# Patient Record
Sex: Female | Born: 1975 | Hispanic: Yes | Marital: Single | Smoking: Never smoker
Health system: Southern US, Community
[De-identification: ages and names within clinical notes are randomized; demographics above are authoritative.]

---

## 2005-07-25 ENCOUNTER — Emergency Department: Payer: Self-pay | Admitting: Internal Medicine

## 2007-03-13 ENCOUNTER — Emergency Department: Payer: Self-pay | Admitting: Emergency Medicine

## 2008-08-12 ENCOUNTER — Ambulatory Visit: Payer: Self-pay | Admitting: Family Medicine

## 2008-10-21 ENCOUNTER — Inpatient Hospital Stay: Payer: Self-pay | Admitting: Specialist

## 2008-11-18 ENCOUNTER — Other Ambulatory Visit: Payer: Self-pay | Admitting: General Surgery

## 2008-11-22 ENCOUNTER — Ambulatory Visit: Payer: Self-pay | Admitting: General Surgery

## 2008-11-28 ENCOUNTER — Ambulatory Visit: Payer: Self-pay | Admitting: General Surgery

## 2010-12-07 ENCOUNTER — Emergency Department: Payer: Self-pay | Admitting: *Deleted

## 2011-05-26 IMAGING — US ABDOMEN ULTRASOUND
1 series · 17 of 25 positions shown · non-contrast
Comparison: none

REASON FOR EXAM: ruq pain with lipase>8666; elevated AST and ALT
COMMENTS:

[Series 1: abdomen ultrasound · 17 of 64 slices shown]
[im 1/64]
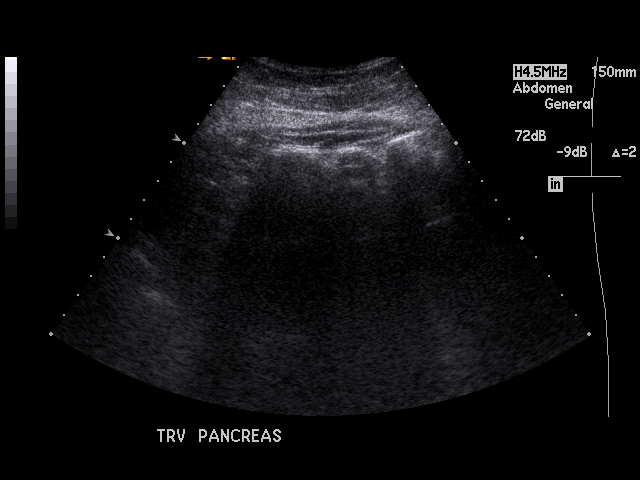
[im 6/64]
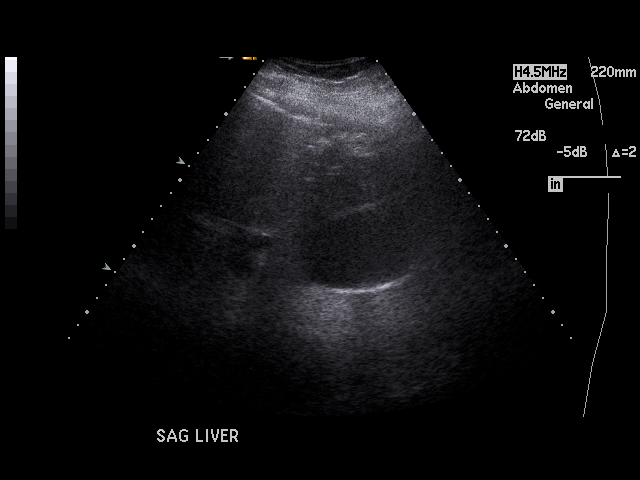
[im 8/64]
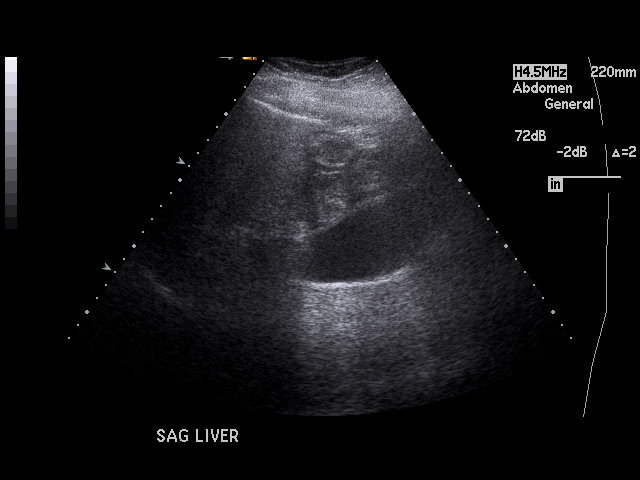
[im 14/64]
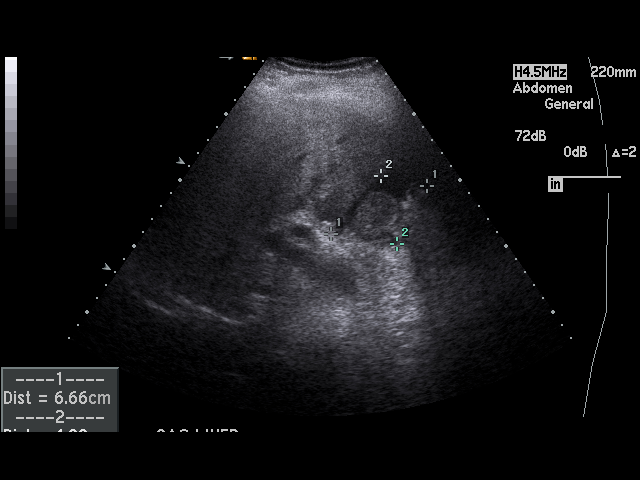
[im 16/64]
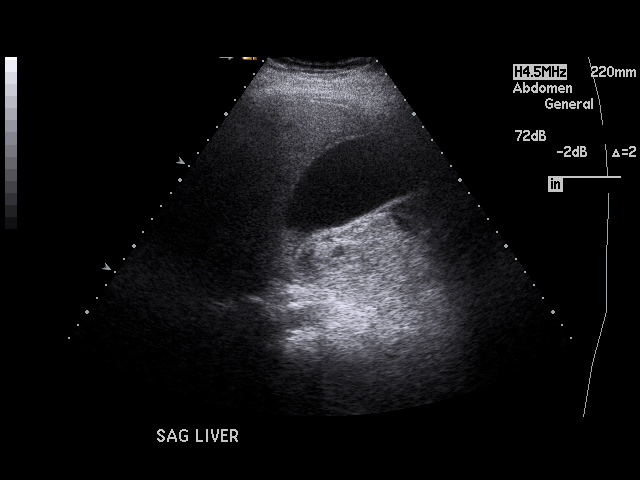
[im 22/64]
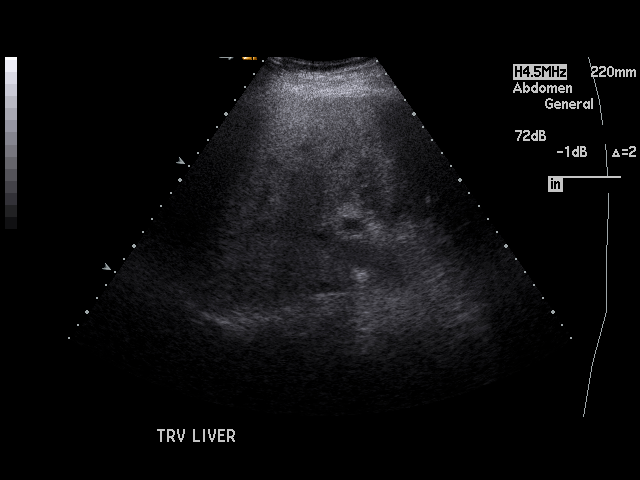
[im 24/64]
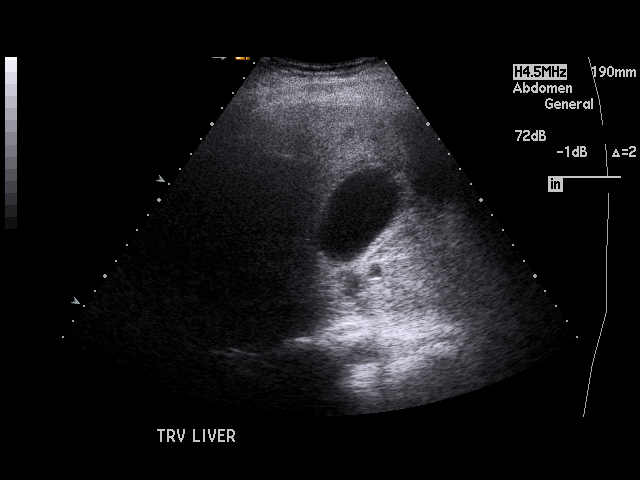
[im 29/64]
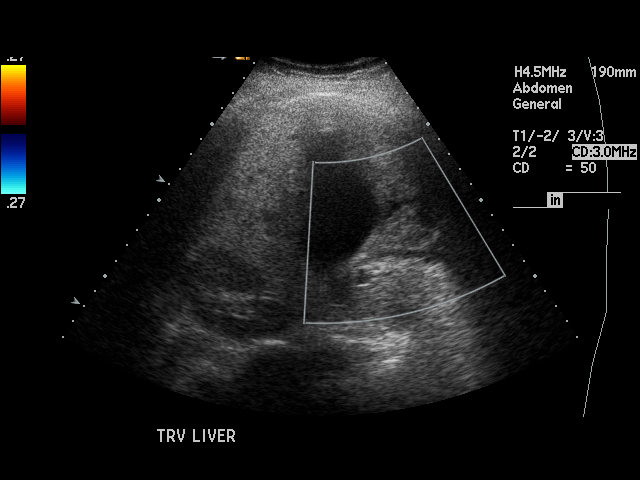
[im 32/64]
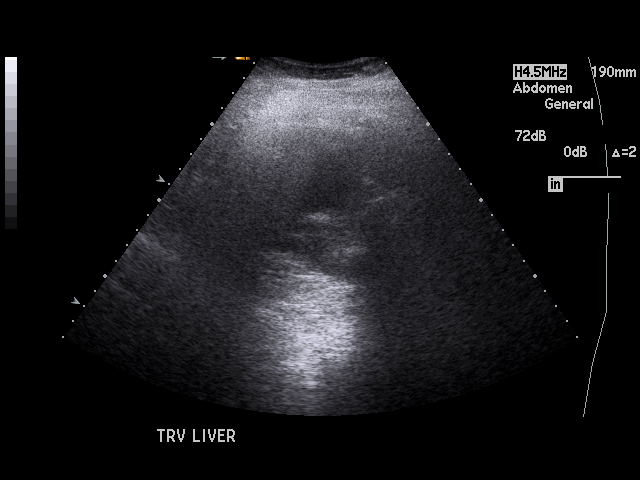
[im 35/64]
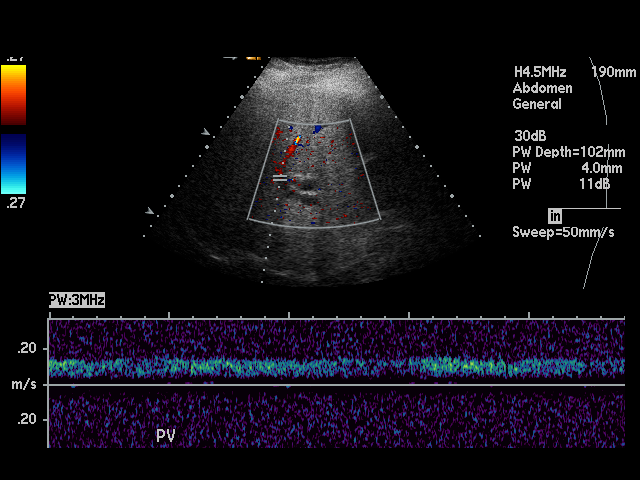
[im 40/64]
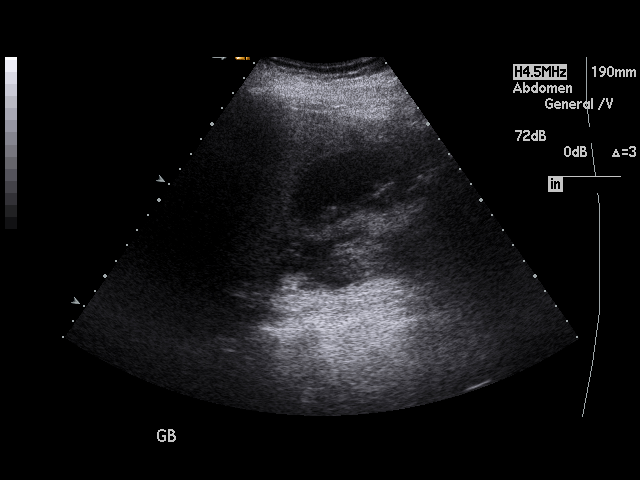
[im 43/64]
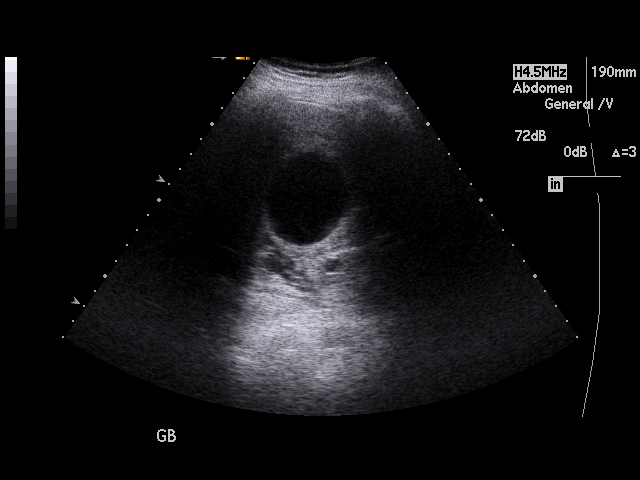
[im 48/64]
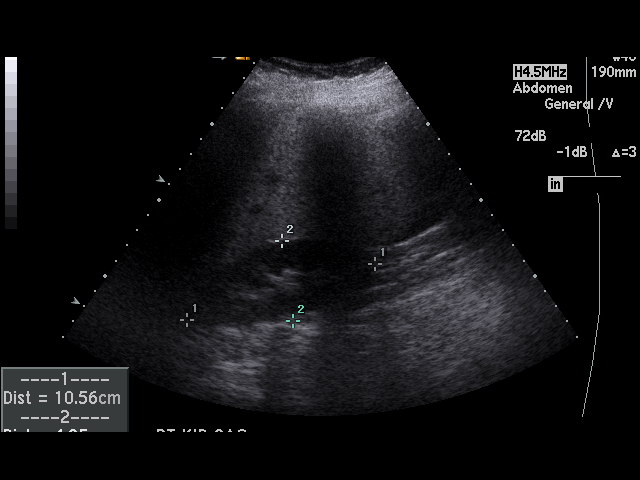
[im 50/64]
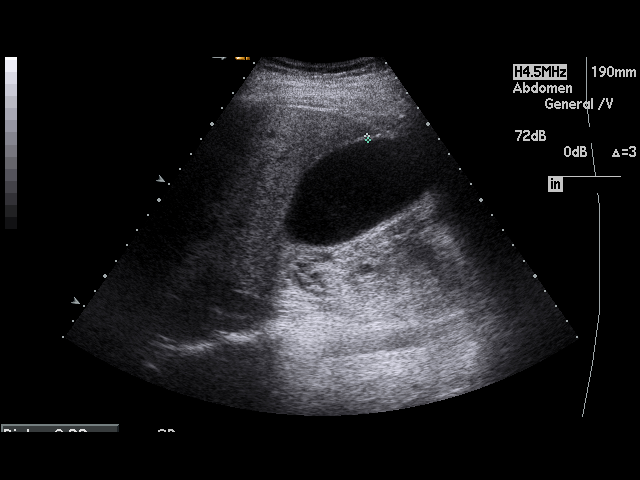
[im 56/64]
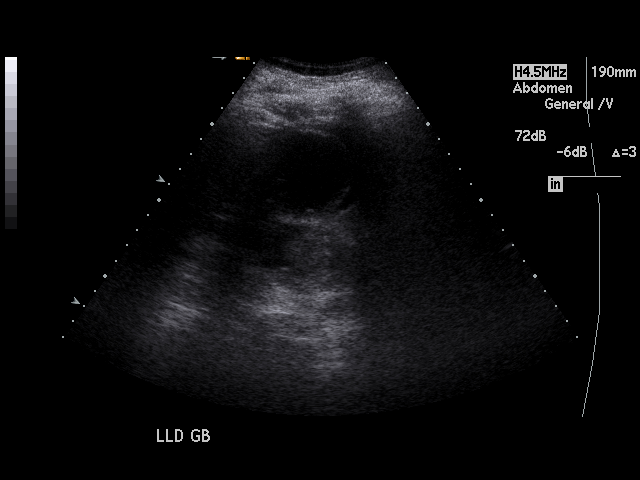
[im 58/64]
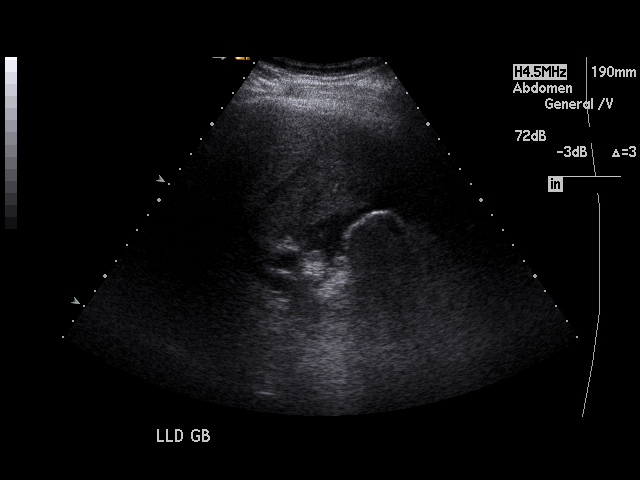
[im 64/64]
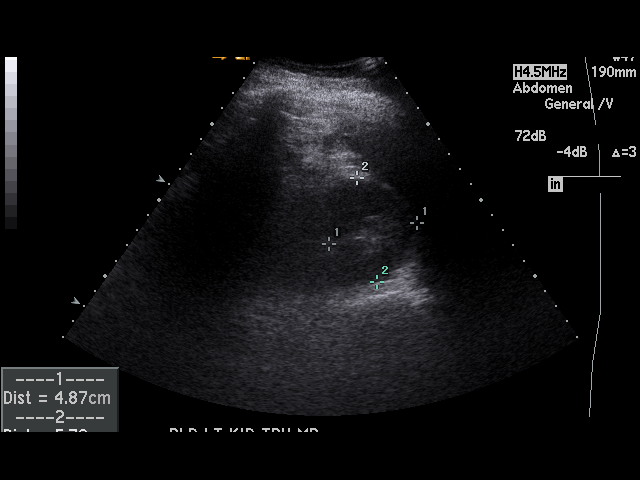

[17 of 25 positions shown; findings below may reference images not displayed]

PROCEDURE:     US  - US ABDOMEN GENERAL SURVEY  - October 21, 2008  [DATE]

RESULT:     The liver demonstrates a homogeneous echotexture. Hepatopetal
flow is demonstrated within the portal vein. The aorta and IVC are not
visualized secondary to bowel gas. The pancreas is not visualized.
Evaluation of the gallbladder fossa demonstrates mobile gallstones within
the gallbladder. There is no evidence of pericholecystic fluid and the
patient does not demonstrate a sonographic Murphy's sign. Gallbladder wall
thickness is 2.2 mm. The common bile duct measures 1.02 cm in diameter.
There does not appear to be echogenic calculi within the common bile duct.
Adjacent to the gallbladder fossa, a hypoechoic mass is identified measuring
6.6 x 4.3 cm. There does not appear to be peristalsis associated with this
mass and this is likely a loop of bowel. A non-bowel mass cannot be excluded
and re-evaluation with ultrasound in 24-48 hours is recommended and/or
further evaluation with CT. Evaluation of the kidneys demonstrates no gross
abnormalities.
IMPRESSION: 1.     Dilated common bile duct and gallstones within the gallbladder. These
findings may represent the sequelae of a stone which passed through the
biliary tree and is not apparent on ultrasound. A gallstone distally within
the common bile duct cannot be completely excluded.
2.     Hypoechoic mass within the gallbladder fossa as described above which
may represent a loop of bowel. This could possibly be further characterized
with CT and/or re-evaluated with ultrasound.
3.     Dr. Jean Pierre of the Emergency Department was informed of these
findings at the time of the initial interpretation.

## 2012-03-01 ENCOUNTER — Ambulatory Visit: Payer: Self-pay | Admitting: Family Medicine

## 2012-04-27 ENCOUNTER — Observation Stay: Payer: Self-pay | Admitting: Obstetrics and Gynecology

## 2012-04-27 LAB — URINALYSIS, COMPLETE
Ketone: NEGATIVE
Nitrite: NEGATIVE
RBC,UR: 42 /HPF (ref 0–5)
Specific Gravity: 1.009 (ref 1.003–1.030)
Squamous Epithelial: 7
Transitional Epi: 3
WBC UR: 134 /HPF (ref 0–5)

## 2012-08-27 ENCOUNTER — Emergency Department: Payer: Self-pay | Admitting: Emergency Medicine

## 2012-08-27 LAB — COMPREHENSIVE METABOLIC PANEL
Alkaline Phosphatase: 122 U/L (ref 50–136)
Anion Gap: 7 (ref 7–16)
BUN: 12 mg/dL (ref 7–18)
Bilirubin,Total: 0.3 mg/dL (ref 0.2–1.0)
Calcium, Total: 8.4 mg/dL — ABNORMAL LOW (ref 8.5–10.1)
Chloride: 106 mmol/L (ref 98–107)
Co2: 29 mmol/L (ref 21–32)
Creatinine: 0.55 mg/dL — ABNORMAL LOW (ref 0.60–1.30)
EGFR (African American): >60
Glucose: 96 mg/dL (ref 65–99)
SGOT(AST): 45 U/L — ABNORMAL HIGH (ref 15–37)
SGPT (ALT): 74 U/L (ref 12–78)
Sodium: 142 mmol/L (ref 136–145)

## 2012-08-27 LAB — URINALYSIS, COMPLETE
Bacteria: NONE SEEN
Bilirubin,UR: NEGATIVE
Ketone: NEGATIVE
Leukocyte Esterase: NEGATIVE
Nitrite: NEGATIVE
Ph: 7 (ref 4.5–8.0)
Protein: NEGATIVE
Specific Gravity: 1.016 (ref 1.003–1.030)
Squamous Epithelial: <1

## 2012-08-27 LAB — PREGNANCY, URINE: Pregnancy Test, Urine: NEGATIVE m[IU]/mL

## 2012-08-27 LAB — CBC
MCH: 29 pg (ref 26.0–34.0)
MCHC: 33.7 g/dL (ref 32.0–36.0)
MCV: 86 fL (ref 80–100)
Platelet: 191 10*3/uL (ref 150–440)
RBC: 3.96 10*6/uL (ref 3.80–5.20)
WBC: 5.3 10*3/uL (ref 3.6–11.0)

## 2012-08-27 LAB — WET PREP, GENITAL

## 2014-09-10 NOTE — H&P (Signed)
L&D Evaluation:  History:   HPI 39 yo G6P5005 at 1660w4d gestational age by LMP.  Pregnancy complicated by AMA and history of cesarean section followed by four successful VBACs.  She presents with dysuria since last night.  She states that at 11pm she tried to urinate she got very little urine out and it was painful. She has the sensation to urinate frequently.  She denies fevers,chills, current back pain. She notes positive fetal movement, no leakage of fluid, no contractions.  She denies vaginal  bleeding.  O+, RI, HBsAg neg, VZI, GBS unk    Patient's Medical History No Chronic Illness    Patient's Surgical History Previous C-Section  cholecystectomy    Medications Pre Natal Vitamins    Allergies NKDA    Social History none    Family History Non-Contributory   ROS:   ROS All systems were reviewed.  HEENT, CNS, GI, GU, Respiratory, CV, Renal and Musculoskeletal systems were found to be normal., unless noted in HPI   Exam:   Vital Signs stable  afebrile    General no apparent distress    Mental Status clear    Chest clear    Heart normal sinus rhythm    Abdomen gravid, non-tender    Estimated Fetal Weight appropriate given gestational age    Back no CVAT    Edema no edema    FHT normal rate with no decels    FHT Description 130/mod var/+accels/no decels    Ucx absent    Skin no lesions    Other UA: Spec Grav 1.009, pH 6.0, Nit neg, LE 3+, WBC 134/hpf, bact 3+, epith 7/hpf   Impression:   Impression UTI, reactive NST   Plan:   Plan UA, EFM/NST, discharge    Comments - Reactive NST - UTI treated with bactrim as she states nitrofurantoin too expensive - follow up not scheduled. she was instructed to call today to set up appt soon given her gestational age. - pyelonephritis precautions given including, fever and new-onset back pain or if symptoms do not improve after a couple of days. - send urine for culture. - interpreter present throughout entire  interview.    Follow Up Appointment need to schedule. in 1 week   Electronic Signatures: Conard NovakJackson, Elise Knobloch D (MD)  (Signed 26-Dec-13 15:39)  Authored: L&D Evaluation   Last Updated: 26-Dec-13 15:39 by Conard NovakJackson, Miguel Christiana D (MD)

## 2018-01-23 ENCOUNTER — Other Ambulatory Visit: Payer: Self-pay

## 2018-01-23 ENCOUNTER — Encounter: Payer: Self-pay | Admitting: Emergency Medicine

## 2018-01-23 ENCOUNTER — Emergency Department
Admission: EM | Admit: 2018-01-23 | Discharge: 2018-01-23 | Disposition: A | Discharge status: Home / Self Care | Payer: Self-pay | Attending: Emergency Medicine | Admitting: Emergency Medicine

## 2018-01-23 DIAGNOSIS — K0889 Other specified disorders of teeth and supporting structures: Secondary | ICD-10-CM | POA: Insufficient documentation

## 2018-01-23 MED ORDER — LIDOCAINE VISCOUS HCL 2 % MT SOLN
15.0000 mL | Freq: Once | OROMUCOSAL | Status: AC
  Administered 2018-01-23: 15 mL via OROMUCOSAL
  Filled 2018-01-23: qty 15

## 2018-01-23 MED ORDER — AMOXICILLIN 500 MG PO CAPS: 500.0000 mg | ORAL_CAPSULE | Freq: Three times a day (TID) | ORAL | 0 refills | Status: AC

## 2018-01-23 MED ORDER — IBUPROFEN 600 MG PO TABS
600.0000 mg | ORAL_TABLET | Freq: Once | ORAL | Status: AC
  Administered 2018-01-23: 600 mg via ORAL
  Filled 2018-01-23: qty 1

## 2018-01-23 MED ORDER — TRAMADOL HCL 50 MG PO TABS: 50.0000 mg | ORAL_TABLET | Freq: Four times a day (QID) | ORAL | 0 refills | Status: AC | PRN

## 2018-01-23 MED ORDER — TRAMADOL HCL 50 MG PO TABS
50.0000 mg | ORAL_TABLET | Freq: Once | ORAL | Status: AC
  Administered 2018-01-23: 50 mg via ORAL
  Filled 2018-01-23: qty 1

## 2018-01-23 MED ORDER — IBUPROFEN 600 MG PO TABS: 600.0000 mg | ORAL_TABLET | Freq: Three times a day (TID) | ORAL | 0 refills | Status: AC | PRN

## 2018-01-23 NOTE — ED Notes (Signed)
See triage note  Presents with tooth pain for about 2 days Provider in with pt on arrival

## 2018-01-23 NOTE — ED Notes (Signed)
Pt verbalizes understanding of d/c instructions, medications and follow up 

## 2018-01-23 NOTE — ED Provider Notes (Signed)
[facility] Emergency Department Provider Note   ____________________________________________   First MD Initiated Contact with Patient 01/23/18 1214     (approximate)  I have reviewed the triage vital signs and the nursing notes.   HISTORY  Chief Complaint Dental Pain    HPI Macaela Presas is a 42 y.o. female patient complain dental pain to the left lower jaw for 2 days.  Patient denies fever associated this complaint.  Patient rates pain as a 5/10.  Patient described the pain is "achy".  No palliative measure for complaint.  History reviewed. No pertinent past medical history.  There are no active problems to display for this patient.   Past Surgical History:  Procedure Laterality Date  . CESAREAN SECTION      Prior to Admission medications   Medication Sig Start Date End Date Taking? Authorizing Provider  amoxicillin (AMOXIL) 500 MG capsule Take 1 capsule (500 mg total) by mouth 3 (three) times daily. 01/23/18   Joni Reining, PA-C  ibuprofen (ADVIL,MOTRIN) 600 MG tablet Take 1 tablet (600 mg total) by mouth every 8 (eight) hours as needed. 01/23/18   Joni Reining, PA-C  traMADol (ULTRAM) 50 MG tablet Take 1 tablet (50 mg total) by mouth every 6 (six) hours as needed. 01/23/18 01/23/19  Joni Reining, PA-C    Allergies Patient has no known allergies.  No family history on file.  Social History Social History   Tobacco Use  . Smoking status: Never Smoker  . Smokeless tobacco: Never Used  Substance Use Topics  . Alcohol use: Never    Frequency: Never  . Drug use: Not on file    Review of Systems Constitutional: No fever/chills Eyes: No visual changes. ENT: Dental pain.   Cardiovascular: Denies chest pain. Respiratory: Denies shortness of breath. Gastrointestinal: No abdominal pain.  No nausea, no vomiting.  No diarrhea.  No constipation. Genitourinary: Negative for dysuria. Musculoskeletal: Negative for back  pain. Skin: Negative for rash. Neurological: Negative for headaches, focal weakness or numbness.   ____________________________________________   PHYSICAL EXAM:  VITAL SIGNS: ED Triage Vitals [01/23/18 1149]  Enc Vitals Group     BP      Pulse      Resp      Temp      Temp src      SpO2      Weight 156 lb (70.8 kg)     Height      Head Circumference      Peak Flow      Pain Score 5     Pain Loc      Pain Edu?      Excl. in GC?    Constitutional: Alert and oriented. Well appearing and in no acute distress. Mouth/Throat: Mucous membranes are moist.  Oropharynx non-erythematous.  Dental caries of teeth #19 and 20. Neck: No stridor.  Cardiovascular: Normal rate, regular rhythm. Grossly normal heart sounds.  Good peripheral circulation. Respiratory: Normal respiratory effort.  No retractions. Lungs CTAB. Skin:  Skin is warm, dry and intact. No rash noted. Psychiatric: Mood and affect are normal. Speech and behavior are normal.  ____________________________________________   LABS (all labs ordered are listed, but only abnormal results are displayed)  Labs Reviewed - No data to display ____________________________________________  EKG   ____________________________________________  RADIOLOGY  ED MD interpretation:    Official radiology report(s): No results found.  ____________________________________________   PROCEDURES  Procedure(s) performed: None  Procedures  Critical  Care performed: No  ____________________________________________   INITIAL IMPRESSION / ASSESSMENT AND PLAN / ED COURSE  As part of my medical decision making, I reviewed the following data within the electronic MEDICAL RECORD NUMBER    Dental pain secondary to caries.  Patient given discharge care instruction.  Patient advised to follow-up from list of dental clinics provided.  Take medication as directed.      ____________________________________________   FINAL CLINICAL  IMPRESSION(S) / ED DIAGNOSES  Final diagnoses:  Pain, dental     ED Discharge Orders         Ordered    amoxicillin (AMOXIL) 500 MG capsule  3 times daily     01/23/18 1255    ibuprofen (ADVIL,MOTRIN) 600 MG tablet  Every 8 hours PRN     01/23/18 1255    traMADol (ULTRAM) 50 MG tablet  Every 6 hours PRN     01/23/18 1255           Note:  This document was prepared using Dragon voice recognition software and may include unintentional dictation errors.    Joni ReiningSmith, Ronald K, PA-C 01/23/18 1300    Emily FilbertWilliams, Jonathan E, MD 01/23/18 1310

## 2018-01-23 NOTE — Discharge Instructions (Signed)
Follow-up from list of dental clinics provided. °OPTIONS FOR DENTAL FOLLOW UP CARE ° °[place] [facility] - Local Safety Net Dental Clinics °http://www.ncdhhs.gov/dph/oralhealth/services/safetynetclinics.htm °  °[facility] (336-562-3123) ° °[facility] [place] (919-933-9087) ° °[facility] ([phone] ext 237) ° °[facility] ([phone]) ° °[facility] ([phone]) °This clinic caters to the indigent population and is on a lottery system. °Location: °[facility], [address], [place] °Clinic Hours: °Wednesdays from 6pm - 9pm, patients seen by a lottery system. °For dates, call or go to www.med.[facility].edu/[facility]/patients/Dental-[facility] °Services: °Cleanings, fillings and simple extractions. °Payment Options: °DENTAL WORK IS FREE OF CHARGE. Bring proof of income or support. °Best way to get seen: °Arrive at 5:15 pm - this is a lottery, NOT first come/first serve, so arriving earlier will not increase your chances of being seen. °  °  °[facility] °[phone] °Select option 1 for emergencies °  °Location: °[facility], [address], [place] °Clinic Hours: °No walk-ins accepted - call the day before to schedule an appointment. °Check in times are 9:30 am and 1:30 pm. °Services: °Simple extractions, temporary fillings, pulpectomy/pulp debridement, uncomplicated abscess drainage. °Payment Options: °PAYMENT IS DUE AT THE TIME OF SERVICE.  Fee is usually $100-200, additional surgical procedures (e.g. abscess drainage) may be extra. °Cash, checks, Visa/MasterCard accepted.  Can file Medicaid if patient is covered for dental - patient should call case worker to check. °No discount for [facility] patients. °Best way to get seen: °MUST call the day before and get onto the schedule. Can usually be seen the next 1-2 days. No walk-ins accepted. °  °   °[facility] °[phone] °  °Location: °[facility], [address], [place] °Clinic Hours: °M, W, Th, F 8am or 1:30pm, Tues 9a or 1:30 - first come/first served. °Services: °Simple extractions, temporary fillings, uncomplicated abscess drainage.  You do not need to be an [place] resident. °Payment Options: °PAYMENT IS DUE AT THE TIME OF SERVICE. °Dental insurance, otherwise sliding scale - bring proof of income or support. °Depending on income and treatment needed, cost is usually $50-200. °Best way to get seen: °Arrive early as it is first come/first served. °  °  °[facility] °[phone] °  °Location: °[address] °Clinic Hours: °Mon-Thu 8a-5p °Services: °Most basic dental services including extractions and fillings. °Payment Options: °PAYMENT IS DUE AT THE TIME OF SERVICE. °Sliding scale, up to 50% off - bring proof if income or support. °Medicaid with dental option accepted. °Best way to get seen: °Call to schedule an appointment, can usually be seen within 2 weeks OR they will try to see walk-ins - show up at 8a or 2p (you may have to wait). °  °  °[facility] °919-245-2435 °[place] RESIDENTS ONLY °  °Location: °[facility], [address], [place], [place] [address] °Clinic Hours: By appointment only. °Monday - Thursday 8am-5pm, Friday 8am-12pm °Services: Cleanings, fillings, extractions. °Payment Options: °PAYMENT IS DUE AT THE TIME OF SERVICE. °Cash, Visa or MasterCard. Sliding scale - $30 minimum per service. °Best way to get seen: °Come in to office, complete packet and make an appointment - need proof of income °or support monies for each household member and proof of [place] residence. °Usually takes about a month to get in. °  °  °[facility] °  [phone] °  °Location: °[address] [place] °Clinic Hours: Walk-in Urgent Care  Dental Services are offered Monday-Friday mornings only. °The numbers of emergencies accepted daily is limited to the number of °providers available. °Maximum 15 - Mondays, Wednesdays & Thursdays °Maximum 10 - Tuesdays & Fridays °Services: °You do not need to be a  [place] resident to be seen for a dental emergency. °Emergencies are defined as pain, swelling, abnormal bleeding, or dental trauma. Walkins will receive x-rays if needed. °NOTE: Dental cleaning is not an emergency. °Payment Options: °PAYMENT IS DUE AT THE TIME OF SERVICE. °Minimum co-pay is $40.00 for uninsured patients. °Minimum co-pay is $3.00 for Medicaid with dental coverage. °Dental Insurance is accepted and must be presented at time of visit. °Medicare does not cover dental. °Forms of payment: Cash, credit card, checks. °Best way to get seen: °If not previously registered with the clinic, walk-in dental registration begins at 7:15 am and is on a first come/first serve basis. °If previously registered with the clinic, call to make an appointment. °  °  °[facility] °[phone] °[place] RESIDENTS ONLY °  °Location: °[address], [place], [place] °Clinic Hours: °Mon-Thu 10a-2p °Services: Extractions only! °Payment Options: °FREE (donations accepted) - bring proof of income or support °Best way to get seen: °Call and schedule an appointment OR come at 8am on the 1st Monday of every month (except for holidays) when it is first come/first served. °  °  °[facility] °[phone] °  °Location: °[address], [place] °Clinic Hours: °Friday mornings °Services, Payment Options, Best way to get seen: °Call for info ° °

## 2018-01-23 NOTE — ED Triage Notes (Signed)
C/O left lower jaw toothache x 2 days.

## 2020-11-16 ENCOUNTER — Other Ambulatory Visit: Payer: Self-pay

## 2020-11-16 DIAGNOSIS — B373 Candidiasis of vulva and vagina: Secondary | ICD-10-CM | POA: Insufficient documentation

## 2020-11-16 LAB — URINALYSIS, COMPLETE (UACMP) WITH MICROSCOPIC
Bacteria, UA: NONE SEEN
Bilirubin Urine: NEGATIVE
Glucose, UA: NEGATIVE mg/dL
Ketones, ur: NEGATIVE mg/dL
Leukocytes,Ua: NEGATIVE
Nitrite: NEGATIVE
Protein, ur: NEGATIVE mg/dL
Specific Gravity, Urine: 1.011 (ref 1.005–1.030)
pH: 6 (ref 5.0–8.0)

## 2020-11-16 LAB — POC URINE PREG, ED: Preg Test, Ur: NEGATIVE

## 2020-11-16 NOTE — ED Triage Notes (Signed)
Pt in with co vaginal irritation, itching and swelling x 6 days. Has tried otc cream for yeast infection without relief. Denies any dysuria.

## 2020-11-17 ENCOUNTER — Emergency Department
Admission: EM | Admit: 2020-11-17 | Discharge: 2020-11-17 | Disposition: A | Discharge status: Home / Self Care | Payer: Self-pay | Attending: Emergency Medicine | Admitting: Emergency Medicine

## 2020-11-17 DIAGNOSIS — B373 Candidiasis of vulva and vagina: Secondary | ICD-10-CM

## 2020-11-17 DIAGNOSIS — B3731 Acute candidiasis of vulva and vagina: Secondary | ICD-10-CM

## 2020-11-17 LAB — CHLAMYDIA/NGC RT PCR (ARMC ONLY)
Chlamydia Tr: NOT DETECTED
N gonorrhoeae: NOT DETECTED

## 2020-11-17 LAB — WET PREP, GENITAL
Clue Cells Wet Prep HPF POC: NONE SEEN
Sperm: NONE SEEN
Trich, Wet Prep: NONE SEEN

## 2020-11-17 MED ORDER — FLUCONAZOLE 150 MG PO TABS: 150.0000 mg | ORAL_TABLET | Freq: Once | ORAL | 0 refills | Status: AC

## 2020-11-17 MED ORDER — FLUCONAZOLE 50 MG PO TABS
150.0000 mg | ORAL_TABLET | ORAL | Status: AC
  Administered 2020-11-17: 150 mg via ORAL
  Filled 2020-11-17: qty 1

## 2020-11-17 NOTE — ED Provider Notes (Signed)
[facility] Emergency Department Provider Note  ____________________________________________   Event Date/Time   First MD Initiated Contact with Patient 11/17/20 0113     (approximate)  I have reviewed the triage vital signs and the nursing notes.   HISTORY  Chief Complaint Vaginal Itching  The patient and/or family speak(s) Spanish.  They understand they have the right to the use of a hospital interpreter, however at this time they prefer to speak directly with me in Spanish.  They know that they can ask for an interpreter at any time.   HPI Tara Navarro is a 45 y.o. female who presents for evaluation of about 6 days of vaginal itching.  She says she is not having any pain, just itching on the outside and on the inside of the vagina.  No vaginal bleeding.  No concern for STDs as she is monogamous with her husband.  She is not having any abdominal pain.  This happened to her once about 3 years ago when she used some ointment which made the symptoms go away but she has tried doing at this time and it has not helped.     No past medical history on file.  There are no problems to display for this patient.   Past Surgical History:  Procedure Laterality Date   CESAREAN SECTION      Prior to Admission medications   Medication Sig Start Date End Date Taking? Authorizing Provider  fluconazole (DIFLUCAN) 150 MG tablet Take 1 tablet (150 mg total) by mouth once for 1 dose. Only take it if you are still having symptoms on 11/19/2020. 11/19/20 11/19/20 Yes Loleta Rose, MD  amoxicillin (AMOXIL) 500 MG capsule Take 1 capsule (500 mg total) by mouth 3 (three) times daily. 01/23/18   Joni Reining, PA-C  ibuprofen (ADVIL,MOTRIN) 600 MG tablet Take 1 tablet (600 mg total) by mouth every 8 (eight) hours as needed. 01/23/18   Joni Reining, PA-C    Allergies Patient has no known allergies.  No family history on file.  Social History Social History    Tobacco Use   Smoking status: Never   Smokeless tobacco: Never  Substance Use Topics   Alcohol use: Never    Review of Systems Constitutional: No fever/chills Cardiovascular: Denies chest pain. Respiratory: Denies shortness of breath. Gastrointestinal: No abdominal pain. Genitourinary: Positive for vaginal itching and irritation.  Negative for vaginal bleeding. Integumentary: Negative for rash.   ____________________________________________   PHYSICAL EXAM:  VITAL SIGNS: ED Triage Vitals  Enc Vitals Group     BP 11/16/20 2304 122/66     Pulse Rate 11/16/20 2304 67     Resp 11/16/20 2304 20     Temp 11/16/20 2304 98 F (36.7 C)     Temp Source 11/16/20 2304 Oral     SpO2 11/16/20 2304 98 %     Weight 11/16/20 2305 68.9 kg (152 lb)     Height 11/16/20 2305 1.575 m (5\' 2" )     Head Circumference --      Peak Flow --      Pain Score 11/16/20 2304 5     Pain Loc --      Pain Edu? --      Excl. in GC? --     Constitutional: Alert and oriented.  Cardiovascular: Normal rate, regular rhythm. Good peripheral circulation. Respiratory: Normal respiratory effort.  No retractions. Gastrointestinal: Soft and nontender. No distention.  Genitourinary: Normal external vaginal exam with  no evidence of infection including cellulitis, abscess, nor herpetic lesions.  Copious thick white vaginal secretions are present in the vaginal vault.  No obvious abnormalities to the cervix.  No tenderness on exam.  ED nurse present as chaperone throughout. Musculoskeletal: No lower extremity tenderness nor edema. No gross deformities of extremities. Neurologic:  Normal speech and language. No gross focal neurologic deficits are appreciated.  Skin:  Skin is warm, dry and intact. Psychiatric: Mood and affect are normal. Speech and behavior are normal.  ____________________________________________   LABS (all labs ordered are listed, but only abnormal results are displayed)  Labs Reviewed   WET PREP, GENITAL - Abnormal; Notable for the following components:      Result Value   Yeast Wet Prep HPF POC PRESENT (*)    WBC, Wet Prep HPF POC MANY (*)    All other components within normal limits  URINALYSIS, COMPLETE (UACMP) WITH MICROSCOPIC - Abnormal; Notable for the following components:   Color, Urine YELLOW (*)    APPearance CLEAR (*)    Hgb urine dipstick SMALL (*)    All other components within normal limits  CHLAMYDIA/NGC RT PCR ([facility] ONLY)            POC URINE PREG, ED   ____________________________________________   Pelvic exam  Date/Time: 11/17/2020 3:00 AM Performed by: Loleta Rose, MD Authorized by: Loleta Rose, MD  Consent: Verbal consent obtained. Consent given by: patient Patient identity confirmed: verbally with patient Patient tolerance: patient tolerated the procedure well with no immediate complications     ____________________________________________  INITIAL IMPRESSION / MDM / ASSESSMENT AND PLAN / ED COURSE  As part of my medical decision making, I reviewed the following data within the electronic MEDICAL RECORD NUMBER Nursing notes reviewed and incorporated and Labs reviewed    Reassuring external exam, no indication of systemic infection, normal vitals, afebrile.  Urinalysis unremarkable.  Urine pregnancy test is negative.  Wet prep notable for yeast, otherwise normal.  Gonorrhea and Chlamydia are negative.  Given that she has tried topical treatment, I gave her a dose of Diflucan and gave her a prescription for another dose if she still has symptoms in 48 hours.  I recommended follow-up at the health department since she does not have a PCP.  She understands and agrees with plan.          ____________________________________________  FINAL CLINICAL IMPRESSION(S) / ED DIAGNOSES  Final diagnoses:  Candida vaginitis     MEDICATIONS GIVEN DURING THIS VISIT:  Medications  fluconazole (DIFLUCAN) tablet 150 mg (150 mg Oral Given  11/17/20 0417)     ED Discharge Orders          Ordered    fluconazole (DIFLUCAN) 150 MG tablet   Once        11/17/20 0408             Note:  This document was prepared using Dragon voice recognition software and may include unintentional dictation errors.   Loleta Rose, MD 11/17/20 [phone]

## 2020-11-17 NOTE — ED Notes (Signed)
ED Provider at bedside, assisted via this RN
